# Patient Record
Sex: Female | Born: 1995 | Race: Black or African American | Hispanic: No | Marital: Single | Smoking: Never smoker
Health system: Southern US, Community
[De-identification: ages and names within clinical notes are randomized; demographics above are authoritative.]

---
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
## 2014-09-11 ENCOUNTER — Emergency Department (HOSPITAL_COMMUNITY)
Admission: EM | Admit: 2014-09-11 | Discharge: 2014-09-11 | Discharge status: Against Medical Advice | Payer: Self-pay | Attending: Emergency Medicine | Admitting: Emergency Medicine

## 2014-09-11 ENCOUNTER — Encounter (HOSPITAL_COMMUNITY): Payer: Self-pay | Admitting: Emergency Medicine

## 2014-09-11 DIAGNOSIS — R109 Unspecified abdominal pain: Secondary | ICD-10-CM | POA: Insufficient documentation

## 2014-09-11 NOTE — ED Notes (Signed)
Called patient's name x 3 and once outside. No answer

## 2014-09-11 NOTE — ED Notes (Signed)
Pt states she has a knot on left side of groin x 1.5 weeks, got worse last night. Pt is on menstrual, started 09/09/14.

## 2014-11-14 ENCOUNTER — Emergency Department (HOSPITAL_COMMUNITY): Payer: No Typology Code available for payment source

## 2014-11-14 ENCOUNTER — Emergency Department (HOSPITAL_COMMUNITY)
Admission: EM | Admit: 2014-11-14 | Discharge: 2014-11-14 | Disposition: A | Discharge status: Home / Self Care | Payer: No Typology Code available for payment source | Attending: Emergency Medicine | Admitting: Emergency Medicine

## 2014-11-14 ENCOUNTER — Encounter (HOSPITAL_COMMUNITY): Payer: Self-pay | Admitting: Emergency Medicine

## 2014-11-14 DIAGNOSIS — Y92410 Unspecified street and highway as the place of occurrence of the external cause: Secondary | ICD-10-CM | POA: Diagnosis not present

## 2014-11-14 DIAGNOSIS — S0990XA Unspecified injury of head, initial encounter: Secondary | ICD-10-CM | POA: Diagnosis not present

## 2014-11-14 DIAGNOSIS — Y9389 Activity, other specified: Secondary | ICD-10-CM | POA: Insufficient documentation

## 2014-11-14 DIAGNOSIS — S3992XA Unspecified injury of lower back, initial encounter: Secondary | ICD-10-CM | POA: Diagnosis present

## 2014-11-14 DIAGNOSIS — S199XXA Unspecified injury of neck, initial encounter: Secondary | ICD-10-CM | POA: Diagnosis not present

## 2014-11-14 DIAGNOSIS — Y998 Other external cause status: Secondary | ICD-10-CM | POA: Insufficient documentation

## 2014-11-14 DIAGNOSIS — Z79899 Other long term (current) drug therapy: Secondary | ICD-10-CM | POA: Diagnosis not present

## 2014-11-14 DIAGNOSIS — M542 Cervicalgia: Secondary | ICD-10-CM

## 2014-11-14 DIAGNOSIS — M549 Dorsalgia, unspecified: Secondary | ICD-10-CM

## 2014-11-14 LAB — I-STAT CHEM 8, ED
BUN: 11 mg/dL (ref 6–23)
CHLORIDE: 103 meq/L (ref 96–112)
CREATININE: 0.7 mg/dL (ref 0.50–1.10)
Calcium, Ion: 1.15 mmol/L (ref 1.12–1.23)
GLUCOSE: 74 mg/dL (ref 70–99)
HEMATOCRIT: 43 % (ref 36.0–46.0)
HEMOGLOBIN: 14.6 g/dL (ref 12.0–15.0)
POTASSIUM: 4.1 meq/L (ref 3.7–5.3)
SODIUM: 141 meq/L (ref 137–147)
TCO2: 24 mmol/L (ref 0–100)

## 2014-11-14 MED ORDER — ONDANSETRON HCL 4 MG PO TABS: 4.0000 mg | ORAL_TABLET | Freq: Four times a day (QID) | ORAL | Status: AC

## 2014-11-14 MED ORDER — HYDROCODONE-ACETAMINOPHEN 5-325 MG PO TABS
2.0000 | ORAL_TABLET | Freq: Once | ORAL | Status: AC
  Administered 2014-11-14: 2 via ORAL
  Filled 2014-11-14: qty 2

## 2014-11-14 MED ORDER — NAPROXEN 500 MG PO TABS: 500.0000 mg | ORAL_TABLET | Freq: Two times a day (BID) | ORAL | Status: AC

## 2014-11-14 MED ORDER — CYCLOBENZAPRINE HCL 10 MG PO TABS
10.0000 mg | ORAL_TABLET | Freq: Two times a day (BID) | ORAL | Status: AC | PRN
Start: 2014-11-14 — End: ?

## 2014-11-14 MED ORDER — ONDANSETRON 4 MG PO TBDP
4.0000 mg | ORAL_TABLET | Freq: Once | ORAL | Status: AC
  Administered 2014-11-14: 4 mg via ORAL
  Filled 2014-11-14: qty 1
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
## 2014-11-14 NOTE — ED Notes (Signed)
Pt is currently in radiology. 
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
## 2014-11-14 NOTE — ED Notes (Signed)
Pt returned to treatment room from radiology. Friend remains at bedside

## 2015-02-28 ENCOUNTER — Emergency Department (HOSPITAL_COMMUNITY)
Admission: EM | Admit: 2015-02-28 | Discharge: 2015-02-28 | Discharge status: Against Medical Advice | Payer: Self-pay | Attending: Emergency Medicine | Admitting: Emergency Medicine

## 2015-02-28 ENCOUNTER — Encounter (HOSPITAL_COMMUNITY): Payer: Self-pay | Admitting: Emergency Medicine

## 2015-02-28 DIAGNOSIS — J029 Acute pharyngitis, unspecified: Secondary | ICD-10-CM | POA: Insufficient documentation

## 2015-02-28 DIAGNOSIS — R0981 Nasal congestion: Secondary | ICD-10-CM | POA: Insufficient documentation

## 2015-02-28 DIAGNOSIS — R52 Pain, unspecified: Secondary | ICD-10-CM | POA: Insufficient documentation

## 2015-02-28 DIAGNOSIS — R05 Cough: Secondary | ICD-10-CM | POA: Insufficient documentation

## 2015-02-28 DIAGNOSIS — R197 Diarrhea, unspecified: Secondary | ICD-10-CM | POA: Insufficient documentation

## 2015-02-28 DIAGNOSIS — R110 Nausea: Secondary | ICD-10-CM | POA: Insufficient documentation

## 2015-02-28 LAB — CBC WITH DIFFERENTIAL/PLATELET
BASOS PCT: 1 % (ref 0–1)
Basophils Absolute: 0 10*3/uL (ref 0.0–0.1)
EOS ABS: 0 10*3/uL (ref 0.0–0.7)
EOS PCT: 1 % (ref 0–5)
HEMATOCRIT: 38.8 % (ref 36.0–46.0)
HEMOGLOBIN: 13.1 g/dL (ref 12.0–15.0)
LYMPHS ABS: 1.5 10*3/uL (ref 0.7–4.0)
Lymphocytes Relative: 33 % (ref 12–46)
MCH: 30 pg (ref 26.0–34.0)
MCHC: 33.8 g/dL (ref 30.0–36.0)
MCV: 89 fL (ref 78.0–100.0)
MONOS PCT: 14 % — AB (ref 3–12)
Monocytes Absolute: 0.6 10*3/uL (ref 0.1–1.0)
Neutro Abs: 2.2 10*3/uL (ref 1.7–7.7)
Neutrophils Relative %: 51 % (ref 43–77)
PLATELETS: 274 10*3/uL (ref 150–400)
RBC: 4.36 MIL/uL (ref 3.87–5.11)
RDW: 12.3 % (ref 11.5–15.5)
WBC: 4.4 10*3/uL (ref 4.0–10.5)

## 2015-02-28 LAB — COMPREHENSIVE METABOLIC PANEL
ALBUMIN: 4.2 g/dL (ref 3.5–5.2)
ALK PHOS: 58 U/L (ref 39–117)
ALT: 10 U/L (ref 0–35)
AST: 19 U/L (ref 0–37)
Anion gap: 4 — ABNORMAL LOW (ref 5–15)
BUN: 10 mg/dL (ref 6–23)
CHLORIDE: 106 mmol/L (ref 96–112)
CO2: 28 mmol/L (ref 19–32)
Calcium: 8.8 mg/dL (ref 8.4–10.5)
Creatinine, Ser: 0.71 mg/dL (ref 0.50–1.10)
GFR calc Af Amer: >90 mL/min (ref >90)
GFR calc non Af Amer: >90 mL/min (ref >90)
GLUCOSE: 97 mg/dL (ref 70–99)
Potassium: 3.8 mmol/L (ref 3.5–5.1)
Sodium: 138 mmol/L (ref 135–145)
Total Bilirubin: 0.5 mg/dL (ref 0.3–1.2)
Total Protein: 7.5 g/dL (ref 6.0–8.3)

## 2015-02-28 LAB — LIPASE, BLOOD: Lipase: 18 U/L (ref 11–59)

## 2015-02-28 NOTE — ED Notes (Signed)
Pt called from triage x 1, no answer 

## 2015-02-28 NOTE — ED Notes (Signed)
Pt called from triage x3, no answer

## 2015-02-28 NOTE — ED Notes (Addendum)
Pt states she has been sick 1 week with congestion, body aches, sore throat, abd pain, cough, n/d.

## 2015-02-28 NOTE — ED Notes (Signed)
Pt called from triage x2, no answer
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
